# Patient Record
Sex: Female | Born: 1995 | Race: Black or African American | Hispanic: No | Smoking: Never smoker
Health system: Southern US, Community
[De-identification: ages and names within clinical notes are randomized; demographics above are authoritative.]

## PROBLEM LIST (undated history)

## (undated) DIAGNOSIS — F32A Depression, unspecified: Secondary | ICD-10-CM

## (undated) DIAGNOSIS — F329 Major depressive disorder, single episode, unspecified: Secondary | ICD-10-CM

## (undated) DIAGNOSIS — F988 Other specified behavioral and emotional disorders with onset usually occurring in childhood and adolescence: Secondary | ICD-10-CM

## (undated) DIAGNOSIS — F419 Anxiety disorder, unspecified: Secondary | ICD-10-CM

## (undated) DIAGNOSIS — T7840XA Allergy, unspecified, initial encounter: Secondary | ICD-10-CM

## (undated) HISTORY — DX: Anxiety disorder, unspecified: F41.9

## (undated) HISTORY — DX: Allergy, unspecified, initial encounter: T78.40XA

## (undated) HISTORY — PX: NO PAST SURGERIES: SHX2092

## (undated) HISTORY — DX: Depression, unspecified: F32.A

## (undated) HISTORY — DX: Other specified behavioral and emotional disorders with onset usually occurring in childhood and adolescence: F98.8

---
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
## 2016-11-16 HISTORY — PX: INTRAUTERINE DEVICE INSERTION: SHX323

## 2017-02-14 DIAGNOSIS — I10 Essential (primary) hypertension: Secondary | ICD-10-CM | POA: Diagnosis not present

## 2017-05-09 DIAGNOSIS — F909 Attention-deficit hyperactivity disorder, unspecified type: Secondary | ICD-10-CM | POA: Diagnosis not present

## 2017-08-01 DIAGNOSIS — F4323 Adjustment disorder with mixed anxiety and depressed mood: Secondary | ICD-10-CM | POA: Diagnosis not present

## 2017-11-14 DIAGNOSIS — Z124 Encounter for screening for malignant neoplasm of cervix: Secondary | ICD-10-CM | POA: Diagnosis not present

## 2017-11-14 DIAGNOSIS — F4323 Adjustment disorder with mixed anxiety and depressed mood: Secondary | ICD-10-CM | POA: Diagnosis not present

## 2017-11-14 DIAGNOSIS — Z01419 Encounter for gynecological examination (general) (routine) without abnormal findings: Secondary | ICD-10-CM | POA: Diagnosis not present

## 2017-11-14 DIAGNOSIS — Z113 Encounter for screening for infections with a predominantly sexual mode of transmission: Secondary | ICD-10-CM | POA: Diagnosis not present

## 2017-11-14 LAB — HM PAP SMEAR: HM Pap smear: NEGATIVE

## 2018-01-08 DIAGNOSIS — Z23 Encounter for immunization: Secondary | ICD-10-CM | POA: Diagnosis not present

## 2018-02-13 DIAGNOSIS — F4323 Adjustment disorder with mixed anxiety and depressed mood: Secondary | ICD-10-CM | POA: Diagnosis not present

## 2018-05-23 DIAGNOSIS — E559 Vitamin D deficiency, unspecified: Secondary | ICD-10-CM | POA: Diagnosis not present

## 2018-05-23 DIAGNOSIS — F4323 Adjustment disorder with mixed anxiety and depressed mood: Secondary | ICD-10-CM | POA: Diagnosis not present

## 2018-08-06 ENCOUNTER — Ambulatory Visit: Payer: Self-pay | Admitting: Family Medicine

## 2018-09-20 DIAGNOSIS — F4323 Adjustment disorder with mixed anxiety and depressed mood: Secondary | ICD-10-CM | POA: Diagnosis not present

## 2018-10-08 ENCOUNTER — Ambulatory Visit: Payer: Self-pay | Admitting: Family Medicine

## 2018-10-22 ENCOUNTER — Encounter: Payer: Self-pay | Admitting: Family Medicine

## 2018-10-22 ENCOUNTER — Ambulatory Visit: Payer: BC Managed Care – PPO | Admitting: Family Medicine

## 2018-10-22 ENCOUNTER — Other Ambulatory Visit: Payer: Self-pay

## 2018-10-22 ENCOUNTER — Encounter (INDEPENDENT_AMBULATORY_CARE_PROVIDER_SITE_OTHER): Payer: Self-pay

## 2018-10-22 VITALS — BP 110/72 | HR 79 | Temp 98.8°F | Resp 18 | Ht 63.75 in | Wt 176.2 lb

## 2018-10-22 DIAGNOSIS — F900 Attention-deficit hyperactivity disorder, predominantly inattentive type: Secondary | ICD-10-CM | POA: Diagnosis not present

## 2018-10-22 DIAGNOSIS — F411 Generalized anxiety disorder: Secondary | ICD-10-CM | POA: Diagnosis not present

## 2018-10-22 DIAGNOSIS — F321 Major depressive disorder, single episode, moderate: Secondary | ICD-10-CM

## 2018-10-22 DIAGNOSIS — F909 Attention-deficit hyperactivity disorder, unspecified type: Secondary | ICD-10-CM | POA: Insufficient documentation

## 2018-10-22 DIAGNOSIS — Z975 Presence of (intrauterine) contraceptive device: Secondary | ICD-10-CM

## 2018-10-22 MED ORDER — SERTRALINE HCL 100 MG PO TABS: ORAL_TABLET | ORAL | 1 refills | Status: DC

## 2018-10-22 NOTE — Progress Notes (Signed)
Subjective:     Vickie Madden is a 23 y.o. female presenting for Establish Care (previous PCP Dr. Suszanne Finch.), Allergic Reaction (happened x 2 after eating shrimp. Symptoms she had vomiting, sometimes diarrhea, nausea, dizzy. Wonders if she developed allergy to shrimp?), and Dizziness (some mornings, x 2 to 3 months. Feels lightheaded.)     HPI   #Allergic Reaction - noticed after eating shrimp - vomiting, nausea, dizziness, diarrhea - happened x 2  #Dizzines - happens in the morning - lightheaded symptoms - has been going on for 2-3 months - generally occurring 1-2 hours after waking - she has typically eaten breakfast - does drink water throughout the day - improves with sitting down and goes away - occurs 2 times per week - no issues with exercise - no palpitations  #Anxiety/Depression - feels like anxiety and depression are not well controlled - has been on lexapro and buspar x 2 years - has had some dose changes  - just recently started buspar BID w/o improvement x 2 month trial - does not think she ever reached remission on medications - has tried: wellbutrin w/o help   #ADHD - diagnosed in college - doing well on adderall - seems to help with symptoms - is noticing some anxiety - no weightloss - no insomnia  Review of Systems  Respiratory: Negative for cough and shortness of breath.   Cardiovascular: Negative for chest pain and palpitations.  Gastrointestinal: Positive for nausea. Negative for vomiting.  Neurological: Positive for light-headedness. Negative for headaches.     Social History   Tobacco Use  Smoking Status Never Smoker  Smokeless Tobacco Never Used        Objective:    BP Readings from Last 3 Encounters:  10/22/18 110/72   Wt Readings from Last 3 Encounters:  10/22/18 176 lb 4 oz (79.9 kg)    BP 110/72   Pulse 79   Temp 98.8 F (37.1 C)   Resp 18   Ht 5' 3.75" (1.619 m)   Wt 176 lb 4 oz (79.9 kg)   LMP 09/25/2018  Comment: has IUD  SpO2 98%   BMI 30.49 kg/m    Physical Exam Constitutional:      General: She is not in acute distress.    Appearance: She is well-developed. She is not diaphoretic.  HENT:     Right Ear: External ear normal.     Left Ear: External ear normal.     Nose: Nose normal.  Eyes:     Conjunctiva/sclera: Conjunctivae normal.  Neck:     Musculoskeletal: Neck supple.  Cardiovascular:     Rate and Rhythm: Normal rate and regular rhythm.     Heart sounds: No murmur.  Pulmonary:     Effort: Pulmonary effort is normal. No respiratory distress.     Breath sounds: Normal breath sounds. No wheezing.  Skin:    General: Skin is warm and dry.     Capillary Refill: Capillary refill takes less than 2 seconds.  Neurological:     Mental Status: She is alert. Mental status is at baseline.  Psychiatric:        Mood and Affect: Mood normal.        Behavior: Behavior normal.     GAD 7 : Generalized Anxiety Score 10/22/2018  Nervous, Anxious, on Edge 3  Control/stop worrying 2  Worry too much - different things 3  Trouble relaxing 3  Restless 1  Easily annoyed or irritable 3  Afraid -  awful might happen 2  Total GAD 7 Score 17  Anxiety Difficulty Very difficult    Depression screen PHQ 2/9 10/22/2018  Decreased Interest 2  Down, Depressed, Hopeless 2  PHQ - 2 Score 4  Altered sleeping 1  Tired, decreased energy 3  Change in appetite 2  Feeling bad or failure about yourself  1  Trouble concentrating 3  Moving slowly or fidgety/restless 0  Suicidal thoughts 0  PHQ-9 Score 14  Difficult doing work/chores Very difficult     Adult ADHD Self Report Scale (most recent)    Adult ADHD Self-Report Scale (ASRS-v1.1) Symptom Checklist - 10/22/18 1542      Part A   1. How often do you have trouble wrapping up the final details of a project, once the challenging parts have been done?  (!) Sometimes  2. How often do you have difficulty getting things done in order when you have to  do a task that requires organization?  (!) Often    3. How often do you have problems remembering appointments or obligations?  (!) Sometimes  4. When you have a task that requires a lot of thought, how often do you avoid or delay getting started?  (!) Very Often    5. How often do you fidget or squirm with your hands or feet when you have to sit down for a long time?  (!) Very Often  6. How often do you feel overly active and compelled to do things, like you were driven by a motor?  Rarely      Part B   7. How often do you make careless mistakes when you have to work on a boring or difficult project?  Sometimes  8. How often do you have difficulty keeping your attention when you are doing boring or repetitive work?  (!) Often    9. How often do you have difficulty concentrating on what people say to you, even when they are speaking to you directly?  (!) Often  10. How often do you misplace or have difficulty finding things at home or at work?  (!) Often    11. How often are you distracted by activity or noise around you?  (!) Often  12. How often do you leave your seat in meetings or other situations in which you are expected to remain seated?  Rarely    13. How often do you feel restless or fidgety?  Sometimes  14. How often do you have difficulty unwinding and relaxing when you have time to yourself?  (!) Very Often    15. How often do you find yourself talking too much when you are in social situations?  Rarely  16. When you are in a conversation, how often do you find yourself finishing the sentences of the people you are talking to, before they can finish them themselves?  (!) Often    17. How often do you have difficulty waiting your turn in situations when turn taking is required?  Rarely  18. How often do you interrupt others when they are busy?  Rarely            Assessment & Plan:   Problem List Items Addressed This Visit      Other   IUD (intrauterine device) in place   ADHD  (attention deficit hyperactivity disorder)    Hand out for non-medication options. Stable per patient. Will obtain records.       Current moderate episode of  major depressive disorder without prior episode (HCC) - Primary    Per patient she never reached remission on current regimen. Will try alternative SSRI (zoloft). If no improvement, would consider trying SNRI or if primarily anxiety worsening and some improvement in depression could consider maximum dose of Buspar 10 mg TID. Highly encouraged therapy and also hand out on non-medication approaches to treat anxiety/depression      Relevant Medications   busPIRone (BUSPAR) 10 MG tablet   sertraline (ZOLOFT) 100 MG tablet   Other Relevant Orders   Ambulatory referral to Psychology   Generalized anxiety disorder   Relevant Medications   busPIRone (BUSPAR) 10 MG tablet   sertraline (ZOLOFT) 100 MG tablet   Other Relevant Orders   Ambulatory referral to Psychology       Return in about 6 weeks (around 12/03/2018) for Deboraha Sprangebbie Gessner for Depression/anxiety f/u.  Lynnda ChildJessica R Arnesia Vincelette, MD

## 2018-10-22 NOTE — Patient Instructions (Addendum)
Medicaiton 1) Stop Lexapro 2) Start Zoloft (Sertraline) 100 mg daily 3) OK to increase to 150 mg after 2 weeks if tolerating and no change in symptoms.  4) Return to see Vickie Madden in 6 weeks  Therapy:  Www[facility]psychologytoday.com Also placed referral for the people in our clinic in the event they accept your insurance   You are going to start a new antidepressant medication.   One of the risks of this medication is increase in suicidal thoughts.   Your suicide Action plan is as follows:  1) Talk to Vickie Madden 2) Call the Suicide Hotline [phone] which is available 24 hours 3) Call the Clinic    The most common side effect is stomach upset. If this happens it means the medication is working. It should get better in 1-3 weeks.   Medication for depression and anxiety often takes 6-8 weeks to have a noticeable difference so stick with it. Also the best way for recovery is taking medication and seeing a therapist -- this is so important.    How to help anxiety and depression  1) Regular Exercise - walking, jogging, cycling, dancing, strength training - aiming for 150 minutes of exercise a week --> Yoga has been shown in research to reduce depression and anxiety -- with even just one hour long session per week  2)  Begin a Mindfulness/Meditation practice -- this can take a little as 3 minutes and is helpful for all kinds of mood issues -- You can find resources in books -- Or you can download apps like  ---- Headspace App  ---- Calm  ---- Insignt Timer ---- Stop, Breathe & Think  # With each of these Apps - you should decline the "start free trial" offer and as you search through the App should be able to access some of their free content. You can also chose to pay for the content if you find one that works well for you.   # Many of them also offer sleep specific content which may help with insomnia  3) Healthy Diet -- Avoid or decrease Caffeine -- Avoid or decrease  Alcohol -- Drink plenty of water, have a balanced diet -- Avoid cigarettes and marijuana (as well as other recreational drugs)  4) Consider contacting a professional therapist   Strategies that help with ADHD  1) To-Do List - Make a daily list of tasks you want to accomplish - break large tasks down into smaller ones that you can do with your attention span - Categorize tasks using ABC system:  ---"A" tasks are of most importance and need to be done more immediately ---"B" tasks may be important, but can wait ---"C" tasks are often easiest, but should not be done first  2) Time Management: set a timer for completing certain tasks. Consider using this to monitor breaks between tasks  3) Delaying Distractibility: When trying to focus on a task, consider having a small notebook to jot down ideas that interrupt concentration. This way you don't forget something and can redirect attention to the immediate task  4) Mindfulness: Try to do only one thing at a time. Multitasking often leads to mistakes and distractions.   5) Minimize Distractions: Limit potential distractions while you are trying to complete tasks. Quiet place, no background noise, consider turning off your phone.   6) Visual Reminders: Use sticky notes, white board, or small notebook to remind yourself of tasks -- and keep in ONE obvious place  7) Reminders/alerts: Set timers or  alerts to reminder yourself of appointments.   8) Calendar/planner: Keep a calendar to maintain your schedule as well as manage your time. Put all deadlines, appointments, etc in the planner and schedule blocks of time for projects at home (exercising, and other goals). Make "appointments" with that time to protect that time for those tasks specifically.   Self-help Materials 1) Resources:  -- Building control surveyorThe Smart but Scattered Guide to Success: How to Use Your Brains' Executive Skills to Keep Up, Stay Calm, and Get Organized at Work and at Home by Arita Missawson and  [place] -- Out of the Fog: Treatment Options and Coping Strategies for Adult Attention Deficit Disorder by Bettina GaviaKevin Murphy and Mickel FuchsSuzanne Levert -- Driven to Distraction (Revised): Recognizing and Coping with Attention Deficit Disorder by Antonieta LovelessEdward Hallowell and Alvie HeidelbergJames Ratey -- Taking Charge of Adult ADHD by Janese Banksussell Barkley -- www.chadd.org and www.PinkCheek.beadditudemag.com  2) Deep breathing - can be done in 3-5 minutes throughout the day.  -- Take a deep breath in through the nose (for 4 seconds), noticing yoru stomach expands as you inhale and take along breath out (for 6 seconds) noticing that your stomach deflates as you exhale. Apps like Calm and Breath2Relax provide short, guided breathing exercises.   3) Mindfulness: The goal is to concentrate on the present moment in a descriptive and non-judgmental manner -- Books: The Mindfulness Prescription for Adult ADHD: An 8-Step Program for Strengthening Your Attention, Managing Emotions, and Achieving Your Goals by Milas KocherZylowska -- Apps: Insight Timer, Headspace, and Buddhify2 -- Websites: www.mindful.org and franticworld.com/free-meditations-from-mindfulness and YouTube videos by Charlene BrookeJon Kabat-Zinn such as "Three Minute Breathing Space" -- Yoga practice   Healthy Habits 1) Try to maintain a healthy lifestyle -- eating lots of fruits and veggies and getting regular exercise  2) Good Sleep Hygiene Habits -- Got to bed and wake up within an hour of the same time every day -- Avoid bright screens (from laptop, phone, TV) within at least 30 minutes before bed. The "blue light" supresses the sleep hormone melatonin and the content may stimulate as well -- Avoid heavy foods and caffeine within at least 2 hours of bedtime -- Maintain a quiet and dark sleep environment (blackout curtains, turn on a fan or white noise to block out disruptive sounds) -- Practicing relaxing activites before bed (taking a shower, reading a book, journaling, meditation app) -- To quiet a busy mid --  consider journaling before bed (jotting down reminders, worry thoughts, as well as positive things like a gratitude list)

## 2018-10-22 NOTE — Assessment & Plan Note (Signed)
Hand out for non-medication options. Stable per patient. Will obtain records.

## 2018-10-22 NOTE — Assessment & Plan Note (Addendum)
Per patient she never reached remission on current regimen. Will try alternative SSRI (zoloft). If no improvement, would consider trying SNRI or if primarily anxiety worsening and some improvement in depression could consider maximum dose of Buspar 10 mg TID. Highly encouraged therapy and also hand out on non-medication approaches to treat anxiety/depression

## 2018-12-03 ENCOUNTER — Other Ambulatory Visit: Payer: Self-pay

## 2018-12-03 ENCOUNTER — Encounter: Payer: Self-pay | Admitting: Family Medicine

## 2018-12-03 ENCOUNTER — Ambulatory Visit: Payer: BC Managed Care – PPO | Admitting: Family Medicine

## 2018-12-03 VITALS — BP 106/62 | HR 71 | Temp 98.3°F | Ht 63.75 in | Wt 172.8 lb

## 2018-12-03 DIAGNOSIS — F411 Generalized anxiety disorder: Secondary | ICD-10-CM

## 2018-12-03 DIAGNOSIS — F900 Attention-deficit hyperactivity disorder, predominantly inattentive type: Secondary | ICD-10-CM | POA: Diagnosis not present

## 2018-12-03 DIAGNOSIS — F321 Major depressive disorder, single episode, moderate: Secondary | ICD-10-CM

## 2018-12-03 MED ORDER — SERTRALINE HCL 100 MG PO TABS: 150.0000 mg | ORAL_TABLET | Freq: Every day | ORAL | 1 refills | Status: DC

## 2018-12-03 MED ORDER — AMPHETAMINE-DEXTROAMPHETAMINE 20 MG PO TABS: 20.0000 mg | ORAL_TABLET | Freq: Two times a day (BID) | ORAL | 0 refills | Status: DC

## 2018-12-03 MED ORDER — AMPHETAMINE-DEXTROAMPHETAMINE 20 MG PO TABS: 20.0000 mg | ORAL_TABLET | Freq: Every day | ORAL | 0 refills | Status: DC

## 2018-12-03 NOTE — Progress Notes (Signed)
Subjective:    Patient ID: Vickie Madden, female    DOB: 25-Jun-1995, 23 y.o.   MRN: [id number]  HPI This is a 23 yo female, patient of Dr. Einar Pheasant, who presents today for follow up of anxiety and  depression. She was seen 10/22/2018 and at that time was taking lexapro, buspar and adderall for ADHD. Medication changes- stop escitalopram, start sertraline and referral was made to psychology. She is in the process of moving to [facility] and has not followed through with setting up counseling. She reports that this is something she is planning to prioritize.  Taking sertraline 100 mg in am and buspar 10 mg bid. Last dose at bedtime. Doesn't sleep well. Difficulty going to sleep, some issue staying asleep. Feels more down than anxious. Denies SI/HI.  Doing well on Adderall 20 mg BID.    Past Medical History:  Diagnosis Date  . ADD (attention deficit disorder)   . Allergy   . Anxiety   . Depression    Past Surgical History:  Procedure Laterality Date  . INTRAUTERINE DEVICE INSERTION  11/2016  . NO PAST SURGERIES     Family History  Problem Relation Age of Onset  . Healthy Mother   . Healthy Father   . Healthy Sister   . Diabetes Paternal Grandmother    Social History   Tobacco Use  . Smoking status: Never Smoker  . Smokeless tobacco: Never Used  Substance Use Topics  . Alcohol use: Yes    Comment: 2 times a week-glass of wine  . Drug use: Never      Review of Systems Per HPI    Objective:   Physical Exam Vitals signs reviewed.  Constitutional:      General: She is not in acute distress.    Appearance: Normal appearance. She is normal weight. She is not ill-appearing or toxic-appearing.  HENT:     Head: Normocephalic and atraumatic.  Eyes:     Conjunctiva/sclera: Conjunctivae normal.  Cardiovascular:     Rate and Rhythm: Normal rate.  Pulmonary:     Effort: Pulmonary effort is normal.  Neurological:     Mental Status: She is alert and oriented to person, place, and time.   Psychiatric:        Mood and Affect: Mood normal.        Behavior: Behavior normal.        Thought Content: Thought content normal.        Judgment: Judgment normal.      BP 106/62 (BP Location: Left Arm, Patient Position: Sitting, Cuff Size: Normal)   Pulse 71   Temp 98.3 F (36.8 C) (Temporal)   Ht 5' 3.75" (1.619 m)   Wt 172 lb 12.8 oz (78.4 kg)   SpO2 99%   BMI 29.89 kg/m  Wt Readings from Last 3 Encounters:  12/03/18 172 lb 12.8 oz (78.4 kg)  10/22/18 176 lb 4 oz (79.9 kg)   Depression screen [facility] 2/9 12/03/2018 12/03/2018 10/22/2018  Decreased Interest 1 1 2   Down, Depressed, Hopeless 1 1 2   PHQ - 2 Score 2 2 4   Altered sleeping 2 2 1   Tired, decreased energy 3 3 3   Change in appetite 0 0 2  Feeling bad or failure about yourself  0 0 1  Trouble concentrating 3 3 3   Moving slowly or fidgety/restless 0 0 0  Suicidal thoughts 0 0 0  PHQ-9 Score 10 10 14   Difficult doing work/chores Very difficult Very difficult Very difficult  Assessment & Plan:  1. Attention deficit hyperactivity disorder (ADHD), predominantly inattentive type - reviewed records from prior PCP and noted regular use of adderall 20 mg bid, will provide 2 months worth - amphetamine-dextroamphetamine (ADDERALL) 20 MG tablet; Take 1 tablet (20 mg total) by mouth 2 (two) times daily.  Dispense: 60 tablet; Refill: 0 - amphetamine-dextroamphetamine (ADDERALL) 20 MG tablet; Take 1 tablet (20 mg total) by mouth daily.  Dispense: 60 tablet; Refill: 0  2. Current moderate episode of major depressive disorder without prior episode (HCC) - will increase sertraline to 150 mg and have her take at bedtime - sertraline (ZOLOFT) 100 MG tablet; Take 1.5 tablets (150 mg total) by mouth at bedtime.  Dispense: 45 tablet; Refill: 1  3. Generalized anxiety disorder - discussed taking second dose of buspar with dinner and avoid taking at bedtime as it may be energizing - sertraline (ZOLOFT) 100 MG tablet; Take 1.5  tablets (150 mg total) by mouth at bedtime.  Dispense: 45 tablet; Refill: 1  - follow up in 6 weeks, sooner if any worsening symptoms or medication side effects.   Olean Reeeborah Valerian Jewel, FNP-BC  [facility]  12/03/2018 9:02 AM

## 2018-12-03 NOTE — Patient Instructions (Addendum)
Increase your sertraline to 1 and 1/2 tablets at bedtime  Continue to look into finding a therapist  Follow up in 6 weeks, can be virtual  Good luck with your move

## 2018-12-04 MED ORDER — BUSPIRONE HCL 10 MG PO TABS: 10.0000 mg | ORAL_TABLET | Freq: Two times a day (BID) | ORAL | 2 refills | Status: DC

## 2018-12-29 DIAGNOSIS — Z1159 Encounter for screening for other viral diseases: Secondary | ICD-10-CM | POA: Diagnosis not present

## 2019-01-15 ENCOUNTER — Encounter: Payer: Self-pay | Admitting: Family Medicine

## 2019-01-16 ENCOUNTER — Ambulatory Visit (INDEPENDENT_AMBULATORY_CARE_PROVIDER_SITE_OTHER): Payer: BC Managed Care – PPO | Admitting: Family Medicine

## 2019-01-16 ENCOUNTER — Other Ambulatory Visit: Payer: Self-pay

## 2019-01-16 VITALS — Ht 63.75 in | Wt 167.0 lb

## 2019-01-16 DIAGNOSIS — F321 Major depressive disorder, single episode, moderate: Secondary | ICD-10-CM | POA: Diagnosis not present

## 2019-01-16 DIAGNOSIS — F900 Attention-deficit hyperactivity disorder, predominantly inattentive type: Secondary | ICD-10-CM | POA: Diagnosis not present

## 2019-01-16 DIAGNOSIS — F411 Generalized anxiety disorder: Secondary | ICD-10-CM | POA: Diagnosis not present

## 2019-01-16 MED ORDER — AMPHETAMINE-DEXTROAMPHETAMINE 20 MG PO TABS: 20.0000 mg | ORAL_TABLET | Freq: Two times a day (BID) | ORAL | 0 refills | Status: DC

## 2019-01-16 MED ORDER — AMPHETAMINE-DEXTROAMPHETAMINE 20 MG PO TABS: 20.0000 mg | ORAL_TABLET | Freq: Every day | ORAL | 0 refills | Status: DC

## 2019-01-16 MED ORDER — BUSPIRONE HCL 10 MG PO TABS: 10.0000 mg | ORAL_TABLET | Freq: Two times a day (BID) | ORAL | 0 refills | Status: DC

## 2019-01-16 MED ORDER — SERTRALINE HCL 100 MG PO TABS: 150.0000 mg | ORAL_TABLET | Freq: Every day | ORAL | 0 refills | Status: DC

## 2019-01-16 NOTE — Progress Notes (Signed)
Virtual Visit via Video Note  I connected with Vickie Madden on 01/16/19 at  8:30 AM EDT by a video enabled telemedicine application and verified that I am speaking with the correct person using two identifiers.  Location: Patient: In her home  Provider: LBPCMila Merry   I discussed the limitations of evaluation and management by telemedicine and the availability of in person appointments. The patient expressed understanding and agreed to proceed.  History of Present Illness: Chief Complaint  Patient presents with  . Follow-up    Tolerating medication well. Taking 1.5 tablets. Pt needs new Rx for Adderall    This is a 23 yo female who presents today for 6 week follow up of depression, ADHD. At last visit, she was in the process of moving and was having difficulty sleeping. Sertraline was increased to 150 mg and she was instructed to move buspirone from bedtime to dinner time. Sleep is a little better but she still can have difficulty falling asleep. She reports improved mood over the last 2 weeks. She has been taking amphetamine-dextromethorphan 20 mg bid around 8 am and 1 pm. She still has feelings of being distracted, especially working from home. She is not getting regular exercise.   Past Medical History:  Diagnosis Date  . ADD (attention deficit disorder)   . Allergy   . Anxiety   . Depression    Past Surgical History:  Procedure Laterality Date  . INTRAUTERINE DEVICE INSERTION  11/2016  . NO PAST SURGERIES     Family History  Problem Relation Age of Onset  . Healthy Mother   . Healthy Father   . Healthy Sister   . Diabetes Paternal Grandmother    Social History   Tobacco Use  . Smoking status: Never Smoker  . Smokeless tobacco: Never Used  Substance Use Topics  . Alcohol use: Yes    Comment: 2 times a week-glass of wine  . Drug use: Never      Observations/Objective: The patient is alert and answers questions appropriately. Visible skin is unremarkable.  Respirations are even and unlabored. No audible wheeze or witnessed cough. Mood and affect are appropriate.   Ht 5' 3.75" (1.619 m)   Wt 167 lb (75.8 kg)   BMI 28.89 kg/m   Wt Readings from Last 3 Encounters:  01/15/19 167 lb (75.8 kg)  12/03/18 172 lb 12.8 oz (78.4 kg)  10/22/18 176 lb 4 oz (79.9 kg)    Assessment and Plan: 1. Current moderate episode of major depressive disorder without prior episode (HCC) -Improved, will continue current medications and have her follow-up with PCP for her CPE in 2 to 3 months - sertraline (ZOLOFT) 100 MG tablet; Take 1.5 tablets (150 mg total) by mouth at bedtime.  Dispense: 135 tablet; Refill: 0  2. Attention deficit hyperactivity disorder (ADHD), predominantly inattentive type -Encouraged her to continue to use nonmedication strategies in addition to medication - amphetamine-dextroamphetamine (ADDERALL) 20 MG tablet; Take 1 tablet (20 mg total) by mouth 2 (two) times daily.  Dispense: 60 tablet; Refill: 0 - amphetamine-dextroamphetamine (ADDERALL) 20 MG tablet; Take 1 tablet (20 mg total) by mouth daily.  Dispense: 60 tablet; Refill: 0  3. Generalized anxiety disorder - sertraline (ZOLOFT) 100 MG tablet; Take 1.5 tablets (150 mg total) by mouth at bedtime.  Dispense: 135 tablet; Refill: 0 - busPIRone (BUSPAR) 10 MG tablet; Take 1 tablet (10 mg total) by mouth 2 (two) times daily.  Dispense: 180 tablet; Refill: 0   [name],  FNP-BC  [facility], [facility]  01/17/2019 8:28 AM   Follow Up Instructions:    I discussed the assessment and treatment plan with the patient. The patient was provided an opportunity to ask questions and all were answered. The patient agreed with the plan and demonstrated an understanding of the instructions.   The patient was advised to call back or seek an in-person evaluation if the symptoms worsen or if the condition fails to improve as anticipated.    Elby Beck, FNP

## 2019-02-19 ENCOUNTER — Encounter: Payer: Self-pay | Admitting: Obstetrics and Gynecology

## 2019-04-06 DIAGNOSIS — Z03818 Encounter for observation for suspected exposure to other biological agents ruled out: Secondary | ICD-10-CM | POA: Diagnosis not present

## 2019-04-11 ENCOUNTER — Ambulatory Visit (INDEPENDENT_AMBULATORY_CARE_PROVIDER_SITE_OTHER): Payer: BC Managed Care – PPO | Admitting: Family Medicine

## 2019-04-11 ENCOUNTER — Other Ambulatory Visit: Payer: Self-pay

## 2019-04-11 ENCOUNTER — Encounter: Payer: Self-pay | Admitting: Family Medicine

## 2019-04-11 VITALS — Temp 96.1°F

## 2019-04-11 DIAGNOSIS — F321 Major depressive disorder, single episode, moderate: Secondary | ICD-10-CM

## 2019-04-11 DIAGNOSIS — F411 Generalized anxiety disorder: Secondary | ICD-10-CM | POA: Diagnosis not present

## 2019-04-11 DIAGNOSIS — Z20828 Contact with and (suspected) exposure to other viral communicable diseases: Secondary | ICD-10-CM | POA: Diagnosis not present

## 2019-04-11 DIAGNOSIS — F900 Attention-deficit hyperactivity disorder, predominantly inattentive type: Secondary | ICD-10-CM | POA: Diagnosis not present

## 2019-04-11 DIAGNOSIS — R112 Nausea with vomiting, unspecified: Secondary | ICD-10-CM | POA: Diagnosis not present

## 2019-04-11 DIAGNOSIS — T43205A Adverse effect of unspecified antidepressants, initial encounter: Secondary | ICD-10-CM

## 2019-04-11 MED ORDER — AMPHETAMINE-DEXTROAMPHETAMINE 20 MG PO TABS: 20.0000 mg | ORAL_TABLET | Freq: Two times a day (BID) | ORAL | 0 refills | Status: DC

## 2019-04-11 NOTE — Progress Notes (Signed)
I connected with Vickie Madden on 04/11/19 at  9:20 AM EST by video and verified that I am speaking with the correct person using two identifiers.   I discussed the limitations, risks, security and privacy concerns of performing an evaluation and management service by video and the availability of in person appointments. I also discussed with the patient that there may be a patient responsible charge related to this service. The patient expressed understanding and agreed to proceed.  Patient location: Home Provider Location: [place] Participants: Vickie Madden and Vickie Madden   Subjective:     Vickie Madden is a 23 y.o. female presenting for Feels like she is having a shock in her brain (gets dizzy, nauseas.) and Medication Refill (refill ADHD medicine)     HPI  #Brain shocks - feels like shocks in the brain - pain is zinging like - for only a second - happens a couple of times a month and has noticed it more frequently - happened several times last week - thought it might be related to medication so she stopped all medication - initially - stopped Buspar, stopped adderall b/c she ran out, stopped zoloft - typically occurring in the morning - has not been happening as much - a little yesterday   - was having vomiting and thought it might be shellfish - vomiting - most recently on Sunday - immediately following a meal  - prior to that was May or June -- thought it was diet related - feels weak/lightheaded and it was over the last week - worse over the last week for the headache - Hydration - not as much water as typical - due to the nausea  Did notice some improvement w/ stopping medication  Sick contact - 2 weeks ago around MIL and was diagnosed with COVID - Husband and her were tested a day 8 after seeing her  - has not been around anyone else since than     Processing - feels harder to process, but worse than normal when not on medication Feels a little  more foggy than normal  Depression/anxiety - felt like the buspar and zoloft were helping - feels like depression is worse than anxiety - feels less of anxiety symptoms - feels more around panic episodes  Adderall 20 mg BID   Review of Systems See HPI  Social History   Tobacco Use  Smoking Status Never Smoker  Smokeless Tobacco Never Used        Objective:   BP Readings from Last 3 Encounters:  12/03/18 106/62  10/22/18 110/72   Wt Readings from Last 3 Encounters:  01/15/19 167 lb (75.8 kg)  12/03/18 172 lb 12.8 oz (78.4 kg)  10/22/18 176 lb 4 oz (79.9 kg)   Temp (!) 96.1 F (35.6 C) Comment: per patient  LMP 03/28/2019    Physical Exam Constitutional:      Appearance: Normal appearance. She is not ill-appearing.  HENT:     Head: Normocephalic and atraumatic.     Right Ear: External ear normal.     Left Ear: External ear normal.  Eyes:     Conjunctiva/sclera: Conjunctivae normal.  Pulmonary:     Effort: Pulmonary effort is normal. No respiratory distress.  Neurological:     Mental Status: She is alert. Mental status is at baseline.  Psychiatric:        Mood and Affect: Mood normal.        Behavior: Behavior normal.  Thought Content: Thought content normal.        Judgment: Judgment normal.       Depression screen [facility] 2/9 04/11/2019 12/03/2018 12/03/2018  Decreased Interest 1 1 1   Down, Depressed, Hopeless 1 1 1   PHQ - 2 Score 2 2 2   Altered sleeping 1 2 2   Tired, decreased energy 2 3 3   Change in appetite 0 0 0  Feeling bad or failure about yourself  0 0 0  Trouble concentrating 3 3 3   Moving slowly or fidgety/restless 0 0 0  Suicidal thoughts 0 0 0  PHQ-9 Score 8 10 10   Difficult doing work/chores Very difficult Very difficult Very difficult           Assessment & Plan:   Problem List Items Addressed This Visit      Other   ADHD (attention deficit hyperactivity disorder) - Primary    Symptoms poorly controlled off medication x  1 week. Restart Adderall. Will need controlled substance and UDS. PDMP reviewed      Relevant Medications   amphetamine-dextroamphetamine (ADDERALL) 20 MG tablet (Start on 06/12/2019)   Current moderate episode of major depressive disorder without prior episode ([place])    Recently stopped zoloft to see if it was the cause of unusual head symptoms - symptoms continued. Notes depression > anxiety. Restart zoloft. Unclear if medication causing her head symptoms. Will hold off on restarting Buspar and reassess.       Generalized anxiety disorder    Restart zoloft. Consider hydroxyzine if worsening anxiety. Hold buspar. Return 1 month       Other Visit Diagnoses    Non-intractable vomiting with nausea, unspecified vomiting type       Serotonin withdrawal syndrome, initial encounter         Vomiting, head "zaps", fatigue -- did have covid exposure so this could be contributing but had negative test ~1 week ago. Recommended retesting as precaution. But do wonder if serotonin withdrawal could explain symptoms. Restart zoloft, monitor symptoms. Hold buspar for now. May consider further work-up vs neurology if neurological zaps continue. Or SSRI wash out period.   Return in about 4 weeks (around 05/09/2019).  Vickie Noe, MD

## 2019-04-11 NOTE — Assessment & Plan Note (Signed)
Restart zoloft. Consider hydroxyzine if worsening anxiety. Hold buspar. Return 1 month

## 2019-04-11 NOTE — Assessment & Plan Note (Signed)
Recently stopped zoloft to see if it was the cause of unusual head symptoms - symptoms continued. Notes depression > anxiety. Restart zoloft. Unclear if medication causing her head symptoms. Will hold off on restarting Buspar and reassess.

## 2019-04-11 NOTE — Assessment & Plan Note (Signed)
Symptoms poorly controlled off medication x 1 week. Restart Adderall. Will need controlled substance and UDS. PDMP reviewed

## 2019-04-15 NOTE — Telephone Encounter (Signed)
Dr. Einar Pheasant I will request these records. I am sending this to you also as FYI or if you need me to do anything else with the information. Thank you

## 2019-06-04 ENCOUNTER — Other Ambulatory Visit: Payer: Self-pay | Admitting: Family Medicine

## 2019-06-04 ENCOUNTER — Encounter: Payer: Self-pay | Admitting: Family Medicine

## 2019-06-04 DIAGNOSIS — R112 Nausea with vomiting, unspecified: Secondary | ICD-10-CM | POA: Diagnosis not present

## 2019-06-04 DIAGNOSIS — F321 Major depressive disorder, single episode, moderate: Secondary | ICD-10-CM

## 2019-06-04 DIAGNOSIS — F411 Generalized anxiety disorder: Secondary | ICD-10-CM

## 2019-06-04 DIAGNOSIS — R109 Unspecified abdominal pain: Secondary | ICD-10-CM | POA: Diagnosis not present

## 2019-06-04 MED ORDER — SERTRALINE HCL 100 MG PO TABS: 150.0000 mg | ORAL_TABLET | Freq: Every day | ORAL | 0 refills | Status: DC

## 2019-06-04 NOTE — Telephone Encounter (Signed)
Dr Selena Batten please co sign to decline RX. I advised patient through my chart that she should still have another prescription for Adderall on file.

## 2019-07-16 ENCOUNTER — Other Ambulatory Visit: Payer: Self-pay | Admitting: Family Medicine

## 2019-07-16 MED ORDER — AMPHETAMINE-DEXTROAMPHETAMINE 20 MG PO TABS: 20.0000 mg | ORAL_TABLET | Freq: Two times a day (BID) | ORAL | 0 refills | Status: DC

## 2019-07-16 NOTE — Telephone Encounter (Signed)
1) Refill provided  2) I'm not finding a lot of information for the blurry vision as a side effect. It certainly could be, but if it is persisting she may want to get an eye exam.

## 2019-07-16 NOTE — Telephone Encounter (Signed)
Left message for patient to call back  

## 2019-07-16 NOTE — Telephone Encounter (Signed)
Patient advised.

## 2019-07-16 NOTE — Telephone Encounter (Signed)
1)Called patient and scheduled in person appointment to follow up on the medication and get UDS done. Scheduled for the first available time that patient could come in-07/18/19. Patient will be out of medication before then. Please review.  2) Also patient states she received her second Moderna vaccine on 07/12/2019 and sine then has noticed some headache/pressure sensation and her eyes feeling "super heavy like." Did have slight blurred vision on Sunday 07/14/19 but no dizziness or any other symptoms. She was wondering if these are side effects from the vaccine?

## 2019-07-18 ENCOUNTER — Encounter: Payer: Self-pay | Admitting: Family Medicine

## 2019-07-18 ENCOUNTER — Ambulatory Visit: Payer: BC Managed Care – PPO | Admitting: Family Medicine

## 2019-07-18 ENCOUNTER — Other Ambulatory Visit: Payer: Self-pay

## 2019-07-18 VITALS — BP 104/78 | HR 73 | Temp 97.9°F | Ht 63.75 in | Wt 176.8 lb

## 2019-07-18 DIAGNOSIS — F900 Attention-deficit hyperactivity disorder, predominantly inattentive type: Secondary | ICD-10-CM

## 2019-07-18 DIAGNOSIS — F321 Major depressive disorder, single episode, moderate: Secondary | ICD-10-CM

## 2019-07-18 DIAGNOSIS — F411 Generalized anxiety disorder: Secondary | ICD-10-CM

## 2019-07-18 MED ORDER — AMPHETAMINE-DEXTROAMPHET ER 30 MG PO CP24: 30.0000 mg | ORAL_CAPSULE | ORAL | 0 refills | Status: DC

## 2019-07-18 MED ORDER — HYDROXYZINE HCL 50 MG PO TABS: 50.0000 mg | ORAL_TABLET | Freq: Three times a day (TID) | ORAL | 1 refills | Status: AC | PRN

## 2019-07-18 NOTE — Assessment & Plan Note (Signed)
PHQ-9 elevated, however, she notes that she feels her depression is going OK. No medication change - continue zoloft

## 2019-07-18 NOTE — Assessment & Plan Note (Signed)
Pt reports worsening concentration. Hx of switching medication every few months/years due to lack of effect. Also notes feeling overwhelmed with anxiety symptoms currently. Will try switching to long acting adderall to see if improvement. Also referral to psych given overall worsening of symptoms in case we are not seeing improvement

## 2019-07-18 NOTE — Progress Notes (Signed)
Subjective:     Vickie Madden is a 24 y.o. female presenting for ADHD (follow up) and Anxiety/depression (follow up)     HPI  #ADHD - concentration is not as good - when she is working has a hard time focusing and concentrating even without anxious symptoms  #Depression/anxiety - restarted the zoloft - the neurological "zaps" resolved  - is still no longer taking buspar - endorses some more anxiety recently - concentration and anxiety symptoms are worse than any depression symptoms she is experiencing  - anxiety - can be worse with work, overstimulation - has started going back to the office a few days a week and there was so much going on that she though she was going to have a panic attack - gets panics when she is overwhelmed  Review of Systems  04/11/2019: Clinic - ADHD - adderall 20 mg, Depression/anxiety - restart zoloft. No buspar  Social History   Tobacco Use  Smoking Status Never Smoker  Smokeless Tobacco Never Used        Objective:    BP Readings from Last 3 Encounters:  07/18/19 104/78  12/03/18 106/62  10/22/18 110/72   Wt Readings from Last 3 Encounters:  07/18/19 176 lb 12 oz (80.2 kg)  01/15/19 167 lb (75.8 kg)  12/03/18 172 lb 12.8 oz (78.4 kg)    BP 104/78   Pulse 73   Temp 97.9 F (36.6 C)   Ht 5' 3.75" (1.619 m)   Wt 176 lb 12 oz (80.2 kg)   LMP 07/11/2019   SpO2 100%   BMI 30.58 kg/m    Physical Exam Constitutional:      General: She is not in acute distress.    Appearance: She is well-developed. She is not diaphoretic.  HENT:     Right Ear: External ear normal.     Left Ear: External ear normal.     Nose: Nose normal.  Eyes:     Conjunctiva/sclera: Conjunctivae normal.  Cardiovascular:     Rate and Rhythm: Normal rate.  Pulmonary:     Effort: Pulmonary effort is normal.  Musculoskeletal:     Cervical back: Neck supple.  Skin:    General: Skin is warm and dry.     Capillary Refill: Capillary refill takes less than 2  seconds.  Neurological:     Mental Status: She is alert. Mental status is at baseline.  Psychiatric:        Mood and Affect: Mood normal.        Behavior: Behavior normal.     Depression screen [facility] 2/9 07/18/2019 04/11/2019 12/03/2018  Decreased Interest 1 1 1   Down, Depressed, Hopeless 1 1 1   PHQ - 2 Score 2 2 2   Altered sleeping 2 1 2   Tired, decreased energy 3 2 3   Change in appetite 1 0 0  Feeling bad or failure about yourself  0 0 0  Trouble concentrating 3 3 3   Moving slowly or fidgety/restless 1 0 0  Suicidal thoughts 0 0 0  PHQ-9 Score 12 8 10   Difficult doing work/chores Very difficult Very difficult Very difficult   GAD 7 : Generalized Anxiety Score 07/18/2019 10/22/2018  Nervous, Anxious, on Edge 3 3  Control/stop worrying 2 2  Worry too much - different things 3 3  Trouble relaxing 2 3  Restless 2 1  Easily annoyed or irritable 3 3  Afraid - awful might happen 2 2  Total GAD 7 Score 17 17  Anxiety  Difficulty Very difficult Very difficult      Adult ADHD Self Report Scale (most recent)    Adult ADHD Self-Report Scale (ASRS-v1.1) Symptom Checklist - 07/18/19 0856      Part A   1. How often do you have trouble wrapping up the final details of a project, once the challenging parts have been done?  (!) Very Often  2. How often do you have difficulty getting things done in order when you have to do a task that requires organization?  (!) Very Often    3. How often do you have problems remembering appointments or obligations?  (!) Often  4. When you have a task that requires a lot of thought, how often do you avoid or delay getting started?  (!) Very Often    5. How often do you fidget or squirm with your hands or feet when you have to sit down for a long time?  (!) Very Often  6. How often do you feel overly active and compelled to do things, like you were driven by a motor?  (!) Often      Part B   7. How often do you make careless mistakes when you have to work on a  boring or difficult project?  (!) Often  8. How often do you have difficulty keeping your attention when you are doing boring or repetitive work?  (!) Very Often    9. How often do you have difficulty concentrating on what people say to you, even when they are speaking to you directly?  (!) Very Often  10. How often do you misplace or have difficulty finding things at home or at work?  (!) Very Often    11. How often are you distracted by activity or noise around you?  (!) Very Often  12. How often do you leave your seat in meetings or other situations in which you are expected to remain seated?  (!) Sometimes    13. How often do you feel restless or fidgety?  (!) Often  14. How often do you have difficulty unwinding and relaxing when you have time to yourself?  (!) Often    15. How often do you find yourself talking too much when you are in social situations?  (!) Often  16. When you are in a conversation, how often do you find yourself finishing the sentences of the people you are talking to, before they can finish them themselves?  (!) Very Often    17. How often do you have difficulty waiting your turn in situations when turn taking is required?  Rarely  18. How often do you interrupt others when they are busy?  (!) Often            Assessment & Plan:   Problem List Items Addressed This Visit      Other   ADHD (attention deficit hyperactivity disorder)    Pt reports worsening concentration. Hx of switching medication every few months/years due to lack of effect. Also notes feeling overwhelmed with anxiety symptoms currently. Will try switching to long acting adderall to see if improvement. Also referral to psych given overall worsening of symptoms in case we are not seeing improvement      Relevant Medications   amphetamine-dextroamphetamine (ADDERALL XR) 30 MG 24 hr capsule   Other Relevant Orders   Pain Mgmt, Profile 8 w/Conf, U   Ambulatory referral to Psychiatry   Current moderate  episode of major depressive  disorder without prior episode ([facility]) - Primary    PHQ-9 elevated, however, she notes that she feels her depression is going OK. No medication change - continue zoloft      Relevant Medications   hydrOXYzine (ATARAX/VISTARIL) 50 MG tablet   Other Relevant Orders   Ambulatory referral to Psychiatry   Generalized anxiety disorder    Was previously on buspar but stopped due to zaps. She is unsure if this was helping with anxiety. Requested a prn medication to try. Does have significant symptoms, but will try hydroxyzine if causing sleepiness may try propranolol. Will also refer to psych so if no improvement we have additional support for comorbid conditions.       Relevant Medications   hydrOXYzine (ATARAX/VISTARIL) 50 MG tablet   Other Relevant Orders   Ambulatory referral to Psychiatry       Return in about 2 months (around 09/17/2019).  Lynnda Child, MD

## 2019-07-18 NOTE — Patient Instructions (Addendum)
1) Anxiety - try hydroxyzine as needed - may make you sleepy  2) ADHD - switch to long acting medication - psych referral -- look around [place] for some options  MyChart me in 2 weeks to let me know how things are going

## 2019-07-18 NOTE — Assessment & Plan Note (Signed)
Was previously on buspar but stopped due to zaps. She is unsure if this was helping with anxiety. Requested a prn medication to try. Does have significant symptoms, but will try hydroxyzine if causing sleepiness may try propranolol. Will also refer to psych so if no improvement we have additional support for comorbid conditions.

## 2019-07-19 LAB — PAIN MGMT, PROFILE 8 W/CONF, U
6 Acetylmorphine: NEGATIVE ng/mL
Alcohol Metabolites: NEGATIVE ng/mL (ref <500)
Amphetamines: NEGATIVE ng/mL
Benzodiazepines: NEGATIVE ng/mL
Buprenorphine, Urine: NEGATIVE ng/mL
Cocaine Metabolite: NEGATIVE ng/mL
Creatinine: 29.2 mg/dL
MDMA: NEGATIVE ng/mL
Marijuana Metabolite: NEGATIVE ng/mL
Opiates: NEGATIVE ng/mL
Oxidant: NEGATIVE ug/mL
Oxycodone: NEGATIVE ng/mL
pH: 6.9 (ref 4.5–9.0)

## 2019-09-09 ENCOUNTER — Other Ambulatory Visit: Payer: Self-pay | Admitting: Family Medicine

## 2019-09-09 DIAGNOSIS — F411 Generalized anxiety disorder: Secondary | ICD-10-CM

## 2019-09-09 DIAGNOSIS — F321 Major depressive disorder, single episode, moderate: Secondary | ICD-10-CM

## 2019-09-09 DIAGNOSIS — F900 Attention-deficit hyperactivity disorder, predominantly inattentive type: Secondary | ICD-10-CM

## 2019-09-10 MED ORDER — SERTRALINE HCL 100 MG PO TABS: 150.0000 mg | ORAL_TABLET | Freq: Every day | ORAL | 0 refills | Status: AC

## 2019-09-10 MED ORDER — AMPHETAMINE-DEXTROAMPHET ER 30 MG PO CP24: 30.0000 mg | ORAL_CAPSULE | ORAL | 0 refills | Status: AC

## 2019-09-10 NOTE — Telephone Encounter (Signed)
Zoloft was last refilled on 06/04/19 for #135 with 0 refills.  Adderall was last refilled 07/18/19 for #30 with 0 refills.  Patient was last seen on 07/18/19 and has no upcoming appts. Are these ok to refill?

## 2019-09-10 NOTE — Telephone Encounter (Signed)
Did she get set up with psychiatry yet? If not then I can refill.

## 2019-09-10 NOTE — Telephone Encounter (Signed)
No suspicious activity noted on PMP aware web site. Urine drug screen is up-to-date. Last office visit is up-to-date. Refill sent to pharmacy.   

## 2019-09-10 NOTE — Telephone Encounter (Signed)
Patient states that she has an appt on 10/18/19 with psychiatry. She states she will just need 1 refill to get her through until that appt.

## 2019-10-03 DIAGNOSIS — F339 Major depressive disorder, recurrent, unspecified: Secondary | ICD-10-CM | POA: Diagnosis not present

## 2019-10-03 DIAGNOSIS — F411 Generalized anxiety disorder: Secondary | ICD-10-CM | POA: Diagnosis not present

## 2019-10-03 DIAGNOSIS — F900 Attention-deficit hyperactivity disorder, predominantly inattentive type: Secondary | ICD-10-CM | POA: Diagnosis not present

## 2019-11-19 DIAGNOSIS — F900 Attention-deficit hyperactivity disorder, predominantly inattentive type: Secondary | ICD-10-CM | POA: Diagnosis not present

## 2019-11-19 DIAGNOSIS — F411 Generalized anxiety disorder: Secondary | ICD-10-CM | POA: Diagnosis not present

## 2019-11-19 DIAGNOSIS — F339 Major depressive disorder, recurrent, unspecified: Secondary | ICD-10-CM | POA: Diagnosis not present

## 2019-12-17 DIAGNOSIS — F339 Major depressive disorder, recurrent, unspecified: Secondary | ICD-10-CM | POA: Diagnosis not present

## 2019-12-17 DIAGNOSIS — F411 Generalized anxiety disorder: Secondary | ICD-10-CM | POA: Diagnosis not present

## 2019-12-17 DIAGNOSIS — F900 Attention-deficit hyperactivity disorder, predominantly inattentive type: Secondary | ICD-10-CM | POA: Diagnosis not present

## 2020-01-14 DIAGNOSIS — F411 Generalized anxiety disorder: Secondary | ICD-10-CM | POA: Diagnosis not present

## 2020-01-14 DIAGNOSIS — F339 Major depressive disorder, recurrent, unspecified: Secondary | ICD-10-CM | POA: Diagnosis not present

## 2020-01-14 DIAGNOSIS — F900 Attention-deficit hyperactivity disorder, predominantly inattentive type: Secondary | ICD-10-CM | POA: Diagnosis not present

## 2020-04-07 DIAGNOSIS — F339 Major depressive disorder, recurrent, unspecified: Secondary | ICD-10-CM | POA: Diagnosis not present

## 2020-04-07 DIAGNOSIS — F900 Attention-deficit hyperactivity disorder, predominantly inattentive type: Secondary | ICD-10-CM | POA: Diagnosis not present

## 2020-04-07 DIAGNOSIS — F411 Generalized anxiety disorder: Secondary | ICD-10-CM | POA: Diagnosis not present

## 2020-08-28 DIAGNOSIS — F411 Generalized anxiety disorder: Secondary | ICD-10-CM | POA: Diagnosis not present

## 2020-08-28 DIAGNOSIS — F900 Attention-deficit hyperactivity disorder, predominantly inattentive type: Secondary | ICD-10-CM | POA: Diagnosis not present

## 2020-08-28 DIAGNOSIS — F339 Major depressive disorder, recurrent, unspecified: Secondary | ICD-10-CM | POA: Diagnosis not present

## 2020-11-20 DIAGNOSIS — F339 Major depressive disorder, recurrent, unspecified: Secondary | ICD-10-CM | POA: Diagnosis not present

## 2020-11-20 DIAGNOSIS — Z79899 Other long term (current) drug therapy: Secondary | ICD-10-CM | POA: Diagnosis not present

## 2020-11-20 DIAGNOSIS — F411 Generalized anxiety disorder: Secondary | ICD-10-CM | POA: Diagnosis not present

## 2020-11-20 DIAGNOSIS — F900 Attention-deficit hyperactivity disorder, predominantly inattentive type: Secondary | ICD-10-CM | POA: Diagnosis not present

## 2020-12-18 DIAGNOSIS — F339 Major depressive disorder, recurrent, unspecified: Secondary | ICD-10-CM | POA: Diagnosis not present

## 2020-12-18 DIAGNOSIS — F411 Generalized anxiety disorder: Secondary | ICD-10-CM | POA: Diagnosis not present

## 2020-12-18 DIAGNOSIS — F900 Attention-deficit hyperactivity disorder, predominantly inattentive type: Secondary | ICD-10-CM | POA: Diagnosis not present

## 2021-01-15 DIAGNOSIS — F411 Generalized anxiety disorder: Secondary | ICD-10-CM | POA: Diagnosis not present

## 2021-01-15 DIAGNOSIS — F339 Major depressive disorder, recurrent, unspecified: Secondary | ICD-10-CM | POA: Diagnosis not present

## 2021-01-15 DIAGNOSIS — F900 Attention-deficit hyperactivity disorder, predominantly inattentive type: Secondary | ICD-10-CM | POA: Diagnosis not present

## 2021-02-12 DIAGNOSIS — F339 Major depressive disorder, recurrent, unspecified: Secondary | ICD-10-CM | POA: Diagnosis not present

## 2021-02-12 DIAGNOSIS — F900 Attention-deficit hyperactivity disorder, predominantly inattentive type: Secondary | ICD-10-CM | POA: Diagnosis not present

## 2021-02-12 DIAGNOSIS — F411 Generalized anxiety disorder: Secondary | ICD-10-CM | POA: Diagnosis not present

## 2021-05-07 DIAGNOSIS — F339 Major depressive disorder, recurrent, unspecified: Secondary | ICD-10-CM | POA: Diagnosis not present

## 2021-05-07 DIAGNOSIS — F900 Attention-deficit hyperactivity disorder, predominantly inattentive type: Secondary | ICD-10-CM | POA: Diagnosis not present

## 2021-05-07 DIAGNOSIS — F411 Generalized anxiety disorder: Secondary | ICD-10-CM | POA: Diagnosis not present
